# Patient Record
Sex: Male | Born: 1960 | Race: White | Hispanic: No | Marital: Married | Smoking: Former smoker
Health system: Southern US, Community
[De-identification: ages and names within clinical notes are randomized; demographics above are authoritative.]

## PROBLEM LIST (undated history)

## (undated) DIAGNOSIS — C8190 Hodgkin lymphoma, unspecified, unspecified site: Secondary | ICD-10-CM

## (undated) DIAGNOSIS — C649 Malignant neoplasm of unspecified kidney, except renal pelvis: Secondary | ICD-10-CM

## (undated) DIAGNOSIS — R911 Solitary pulmonary nodule: Principal | ICD-10-CM

## (undated) DIAGNOSIS — N529 Male erectile dysfunction, unspecified: Secondary | ICD-10-CM

## (undated) DIAGNOSIS — I10 Essential (primary) hypertension: Secondary | ICD-10-CM

## (undated) DIAGNOSIS — E039 Hypothyroidism, unspecified: Secondary | ICD-10-CM

## (undated) DIAGNOSIS — Z9289 Personal history of other medical treatment: Secondary | ICD-10-CM

## (undated) DIAGNOSIS — E7800 Pure hypercholesterolemia, unspecified: Secondary | ICD-10-CM

## (undated) HISTORY — PX: OTHER SURGICAL HISTORY: SHX169

## (undated) HISTORY — DX: Male erectile dysfunction, unspecified: N52.9

## (undated) HISTORY — DX: Pure hypercholesterolemia, unspecified: E78.00

## (undated) HISTORY — DX: Malignant neoplasm of unspecified kidney, except renal pelvis: C64.9

## (undated) HISTORY — DX: Essential (primary) hypertension: I10

## (undated) HISTORY — DX: Hypothyroidism, unspecified: E03.9

## (undated) HISTORY — DX: Hodgkin lymphoma, unspecified, unspecified site: C81.90

---

## 2013-08-12 DIAGNOSIS — R319 Hematuria, unspecified: Secondary | ICD-10-CM | POA: Insufficient documentation

## 2013-12-12 DIAGNOSIS — R911 Solitary pulmonary nodule: Secondary | ICD-10-CM

## 2013-12-12 HISTORY — DX: Solitary pulmonary nodule: R91.1

## 2014-01-02 DIAGNOSIS — C649 Malignant neoplasm of unspecified kidney, except renal pelvis: Secondary | ICD-10-CM | POA: Insufficient documentation

## 2014-04-14 ENCOUNTER — Other Ambulatory Visit (HOSPITAL_COMMUNITY): Payer: Self-pay | Admitting: Family Medicine

## 2014-04-14 DIAGNOSIS — R918 Other nonspecific abnormal finding of lung field: Secondary | ICD-10-CM

## 2014-04-23 ENCOUNTER — Ambulatory Visit (HOSPITAL_COMMUNITY)
Admission: RE | Admit: 2014-04-23 | Discharge: 2014-04-23 | Disposition: A | Discharge status: Home / Self Care | Payer: BC Managed Care – PPO | Source: Ambulatory Visit | Attending: Family Medicine | Admitting: Family Medicine

## 2014-04-23 DIAGNOSIS — Z905 Acquired absence of kidney: Secondary | ICD-10-CM | POA: Insufficient documentation

## 2014-04-23 DIAGNOSIS — N200 Calculus of kidney: Secondary | ICD-10-CM | POA: Insufficient documentation

## 2014-04-23 DIAGNOSIS — R918 Other nonspecific abnormal finding of lung field: Secondary | ICD-10-CM

## 2014-04-23 DIAGNOSIS — R911 Solitary pulmonary nodule: Secondary | ICD-10-CM | POA: Insufficient documentation

## 2014-04-23 LAB — GLUCOSE, CAPILLARY: GLUCOSE-CAPILLARY: 98 mg/dL (ref 70–99)

## 2014-04-23 MED ORDER — FLUDEOXYGLUCOSE F - 18 (FDG) INJECTION
10.6000 | Freq: Once | INTRAVENOUS | Status: AC | PRN
  Administered 2014-04-23: 10.6 via INTRAVENOUS

## 2014-05-28 DIAGNOSIS — R911 Solitary pulmonary nodule: Secondary | ICD-10-CM | POA: Insufficient documentation

## 2014-05-29 ENCOUNTER — Encounter: Payer: BC Managed Care – PPO | Admitting: Thoracic Surgery (Cardiothoracic Vascular Surgery)

## 2014-05-29 ENCOUNTER — Encounter: Payer: Self-pay | Admitting: Cardiothoracic Surgery

## 2014-05-29 ENCOUNTER — Ambulatory Visit (INDEPENDENT_AMBULATORY_CARE_PROVIDER_SITE_OTHER): Payer: BC Managed Care – PPO | Admitting: Cardiothoracic Surgery

## 2014-05-29 VITALS — BP 119/80 | HR 88 | Resp 20 | Ht 69.0 in | Wt 186.0 lb

## 2014-05-29 DIAGNOSIS — E7800 Pure hypercholesterolemia, unspecified: Secondary | ICD-10-CM | POA: Insufficient documentation

## 2014-05-29 DIAGNOSIS — F419 Anxiety disorder, unspecified: Secondary | ICD-10-CM | POA: Insufficient documentation

## 2014-05-29 DIAGNOSIS — C8190 Hodgkin lymphoma, unspecified, unspecified site: Secondary | ICD-10-CM | POA: Insufficient documentation

## 2014-05-29 DIAGNOSIS — R911 Solitary pulmonary nodule: Secondary | ICD-10-CM

## 2014-05-29 DIAGNOSIS — E079 Disorder of thyroid, unspecified: Secondary | ICD-10-CM | POA: Insufficient documentation

## 2014-05-29 DIAGNOSIS — K219 Gastro-esophageal reflux disease without esophagitis: Secondary | ICD-10-CM | POA: Insufficient documentation

## 2014-05-29 DIAGNOSIS — N529 Male erectile dysfunction, unspecified: Secondary | ICD-10-CM | POA: Insufficient documentation

## 2014-05-29 DIAGNOSIS — C649 Malignant neoplasm of unspecified kidney, except renal pelvis: Secondary | ICD-10-CM

## 2014-05-29 DIAGNOSIS — I10 Essential (primary) hypertension: Secondary | ICD-10-CM | POA: Insufficient documentation

## 2014-05-29 DIAGNOSIS — C4490 Unspecified malignant neoplasm of skin, unspecified: Secondary | ICD-10-CM | POA: Insufficient documentation

## 2014-05-29 NOTE — Progress Notes (Signed)
[address]       [place] [address]             [phone]                    [name] [facility] Medical Record #[id number] Date of Birth: 07-08-1961  Referring: Milus Height, MD Primary Care: Rory Percy, MD  Chief Complaint:    Chief Complaint  Patient presents with  . Lung Lesion    Surgical eval on lung nodule, PET Scan 04/23/14, Chest CT 04/10/14    History of Present Illness:    Ryan Black 53 y.o. male is seen in the office  today for lung nodules and hypermetabolic mediastinal node. Patient  has  previous history of Hodgkin disease diagnosed and treated his radiation in 1990 possibly stage I disease. 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 Brendia Sacks, [facility] for stage I disease. On 04/00 sclerae 2015 patient underwent a dedicated chest CT for previously noticed lung nodule 1.7 x 1.4 cm nodular region in the left upper lobe near the apex. Dr. Rory Percy his primary care provider order his PET/CT on 04/27/2014 which demonstrated hypermetabolic small right paratracheal lymph node associated with hypermetabolic of the subcarinal lymph nodes and possible right hilar hypermetabolic lymphoid tissue 1.7 cm lesion in the left upper lobe shows low level FDG call but concerning for low FDG avid malignancy. Patient admits history of tobacco use for 30 years but quit about 30 years.   Pathology from October 2014 ACCESSION NUMBER: [id number] RECEIVED: 09/16/2013 ORDERING PHYSICIAN: Ivin Booty , MD PATIENT NAME: Ryan Black SURGICAL PATHOLOGY REPORT  FINAL PATHOLOGIC DIAGNOSIS MICROSCOPIC EXAMINATION AND DIAGNOSIS  A. KIDNEY (LEFT), PARTIAL NEPHRECTOMY: Papillary renal cell carcinoma, Fuhrman grade 2, 2.2 cm in greatest dimension. See comment.  Comment: The partial nephrectomy shows a 2.2 cm in  greatest dimension circumscribed papillary renal cell carcinoma. The tumor focally abuts the capsule; however all of the soft tissue resection margins are negative for tumor.        Current Activity/ Functional Status:  Patient is independent with mobility/ambulation, transfers, ADL's, IADL's.   Zubrod Score: At the time of surgery this patient's most appropriate activity status/level should be described as: [x]     0    Normal activity, no symptoms []     1    Restricted in physical strenuous activity but ambulatory, able to do out light work []     2    Ambulatory and capable of self care, unable to do work activities, up and about               >50 % of waking hours                              []     3    Only limited self care, in bed greater than 50% of waking hours []     4    Completely disabled, no self care, confined to bed or chair []     5    Moribund   Past Medical History  Diagnosis Date  . Hypertension   . Hypothyroidism   . ED (erectile dysfunction)   . Hypercholesteremia   . Hodgkin disease 1999, treated with radiation to chest   .  Renal cell carcinoma Partial nephrectomy     left    Past Surgical History  Procedure Laterality Date  . Eye surgery at age 72    . 15 lymph node-right side of neck      Family History  Problem Relation Age of Onset  . Heart disease Brother     MI age 2  . Aortic dissection Father     at age 66  . Hearing loss Mother     History   Social History  . Marital Status: Married    Spouse Name: N/A    Number of Children: 2  . Years of Education: N/A   Occupational History  . Not on file.   Social History Main Topics  . Smoking status: Former Research scientist (life sciences)  . Smokeless tobacco: Never Used  . Alcohol Use: No  . Drug Use: No  . Sexual Activity: Not on file   Other Topics Concern  . Not on file   Social History Narrative  . No narrative on file    History  Smoking status  . Former Smoker  Smokeless tobacco  .  Never Used    History  Alcohol Use No     No Known Allergies  Current Outpatient Prescriptions  Medication Sig Dispense Refill  . levothyroxine (SYNTHROID, LEVOTHROID) 50 MCG tablet Take 50 mcg by mouth daily before breakfast.       . lisinopril-hydrochlorothiazide (PRINZIDE,ZESTORETIC) 20-25 MG per tablet Take 1 tablet by mouth daily.       . tadalafil (CIALIS) 20 MG tablet Take 20 mg by mouth daily as needed for erectile dysfunction.       No current facility-administered medications for this visit.     Review of Systems:     Cardiac Review of Systems: Y or N  Chest Pain [  n  ]  Resting SOB [ n  ] Exertional SOB  [n ]  Orthopnea [  n]   Pedal Edema [ n  ]    Palpitations [ n ] Syncope  [n  ]   Presyncope [ n  ]  General Review of Systems: [Y] = yes [  ]=no Constitional: recent weight change [ n ];  Wt loss over the last 3 months [  n ] anorexia [  ]; fatigue [ y ]; nausea [  ]; night sweats [  ]; fever [  ]; or chills [  ];          Dental: poor dentition[  dentures]; Last Dentist visit:   Eye : blurred vision [  ]; diplopia [   ]; vision changes [  ];  Amaurosis fugax[  ]; Resp: cough [  ];  wheezing[  ];  hemoptysis[  ]; shortness of breath[  ]; paroxysmal nocturnal dyspnea[  ]; dyspnea on exertion[  ]; or orthopnea[  ];  GI:  gallstones[  ], vomiting[  ];  dysphagia[  ]; melena[  ];  hematochezia [  ]; heartburn[  ];   Hx of  Colonoscopy[  ]; GU: kidney stones [  ]; hematuria[  ];   dysuria [  ];  nocturia[  ];  history of     obstruction [  ]; urinary frequency [  ]             Skin: rash, swelling[  ];, hair loss[  ];  peripheral edema[  ];  or itching[  ]; Musculosketetal: myalgias[  ];  joint swelling[  ];  joint erythema[  ];  joint pain[  ];  back pain[  ];  Heme/Lymph: bruising[  ];  bleeding[  ];  anemia[  ];  Neuro: TIA[  ];  headaches[  ];  stroke[  ];  vertigo[  ];  seizures[ n ];   paresthesias[  ];  difficulty walking[n  ];  Psych:depression[  ]; anxiety[   ];  Endocrine: diabetes[ n ];  thyroid dysfunction[ n ];  Immunizations: Flu up to date [  ]; Pneumococcal up to date [  ];  Other:  Physical Exam: BP 119/80  Pulse 88  Resp 20  Ht 5\' 9"  (1.753 m)  Wt 186 lb (84.369 kg)  BMI 27.45 kg/m2  SpO2 98%  PHYSICAL EXAMINATION:  General appearance: alert, cooperative, appears stated age and no distress Neurologic: intact Heart: regular rate and rhythm, S1, S2 normal, no murmur, click, rub or gallop Lungs: clear to auscultation bilaterally Abdomen: soft, non-tender; bowel sounds normal; no masses,  no organomegaly Extremities: extremities normal, atraumatic, no cyanosis or edema and Homans sign is negative, no sign of DVT No cervical or axillary adenopathy  Healing scar abdomen from recent resection " premelenoma"  Diagnostic Studies & Laboratory data:     Recent Radiology Findings:    EXAM:  NUCLEAR MEDICINE PET SKULL BASE TO THIGH  TECHNIQUE:  10.6 mCi F-18 FDG was injected intravenously. Full-ring PET imaging  was performed from the skull base to thigh after the radiotracer. CT  data was obtained and used for attenuation correction and anatomic  localization.  FASTING BLOOD GLUCOSE: Value: 98 mg/dl  COMPARISON: Chest CT from 04/10/2014  FINDINGS:  NECK  No hypermetabolic lymph nodes in the neck.  CHEST  6 mm short axis right paratracheal lymph node is markedly  hypermetabolic with SUV max = 6.4 (image 57). Small subcarinal lymph  nodes are also hypermetabolic with SUV max = 3.4. Focal  hypermetabolism is seen in the right hilum although no definite  underlying lymphadenopathy is evident on this uninfused CT.  The 1.7 x 1.4 cm irregular sub solid lesion in the anteromedial left  upper lobe shows FDG uptake with SUV max = 2. Thoracic surgical  consultation may be 5 mm right middle lobe pulmonary nodule on image  44 does not show hypermetabolism, but is below the ability for  reliable resolution on PET imaging. 3 mm left  upper lobe nodule is  seen on image 30  ABDOMEN/PELVIS  No abnormal hypermetabolic activity within the liver, pancreas,  adrenal glands, or spleen. No hypermetabolic lymph nodes in the  abdomen or pelvis.  Multiple nonobstructing stones are seen in the upper left kidney,  the largest measuring approximately 8 mm in diameter. Focal area of  thinning in the left interpolar kidney is presumably site of partial  nephrectomy.  SKELETON  No focal hypermetabolic activity to suggest skeletal metastasis.  IMPRESSION:  Hypermetabolic small right paratracheal lymph node associated with  hypermetabolism in the subcarinal lymph nodes and possible right  hilar hypermetabolic lymphoid tissue. Although these lymph nodes are  small, the detectable FDG accumulation is concerning for metastatic  involvement.  17 mm sub solid lesion in the anteromedial left upper lobe shows low  level FDG accumulation. While the uptake level is borderline between  infectious/ inflammatory and malignant etiology, differentiated or  low-grade adenocarcinoma can be poorly FDG avid. Thoracic surgery  consultation may be indicated.  Electronically Signed  By: Misty Stanley M.D.  On: 04/23/2014 15:35    Recent Lab Findings: No results found for this  basename: WBC, HGB, HCT, PLT, GLUCOSE, CHOL, TRIG, HDL, LDLDIRECT, LDLCALC, ALT, AST, NA, K, CL, CREATININE, BUN, CO2, TSH, INR, GLUF, HGBA1C      Assessment / Plan:      Patient with history of Hodgkin's lymphoma in 1999 and more recently renal cell carcinoma presents with hypermetabolic mediastinal nodes small and multiple lung nodules that are to small to characterize. I reviewed the radiographic findings with the patient and his wife and recommended that we proceed with bronchoscopy ebus possible mediastinoscopy to biopsy the hypermetabolic mediastinal node. Risks and options of surgery are discussed with the patient in detail and he is willing to proceed. Tentatively  planned for June 24.   Grace Isaac MD      [address] [place] [address] Office [phone]   Beeper [phone]  05/29/2014 5:58 PM

## 2014-05-30 ENCOUNTER — Encounter (HOSPITAL_COMMUNITY): Payer: Self-pay | Admitting: Pharmacy Technician

## 2014-05-30 ENCOUNTER — Encounter: Payer: BC Managed Care – PPO | Admitting: Thoracic Surgery (Cardiothoracic Vascular Surgery)

## 2014-05-30 ENCOUNTER — Other Ambulatory Visit: Payer: Self-pay | Admitting: *Deleted

## 2014-05-30 DIAGNOSIS — R911 Solitary pulmonary nodule: Secondary | ICD-10-CM

## 2014-05-31 NOTE — Pre-Procedure Instructions (Signed)
Ryan Black  05/31/2014   Your procedure is scheduled on:  June 24  Report to [facility] Admitting at 06:30 AM.  Call this number if you have problems the morning of surgery: [phone]   Remember:   Do not eat food or drink liquids after midnight.   Take these medicines the morning of surgery with A SIP OF WATER: Levothyroxine, Ranitidine   STOP Aspirin today   STOP/ Do not take Aspirin, Aleve, Naproxen, Advil, Ibuprofen, Motrin, Vitamins, Herbs, or Supplements starting today   Do not wear jewelry, make-up or nail polish.  Do not wear lotions, powders, or perfumes. You may wear deodorant.  Do not shave 48 hours prior to surgery. Men may shave face and neck.  Do not bring valuables to the hospital.  [facility] is not responsible  for any belongings or valuables.               Contacts, dentures or bridgework may not be worn into surgery.  Leave suitcase in the car. After surgery it may be brought to your room.  For patients admitted to the hospital, discharge time is determined by your treatment team.               Patients discharged the day of surgery will not be allowed to drive home.  Name and phone number of your driver: Family/ Friend  Special Instructions: See [facility] Preparing For Surgery   Please read over the following fact sheets that you were given: Pain Booklet, Coughing and Deep Breathing, Blood Transfusion Information and Surgical Site Infection Prevention

## 2014-05-31 NOTE — Pre-Procedure Instructions (Signed)
[facility] - Preparing for Surgery  Before surgery, you can play an important role.  Because skin is not sterile, your skin needs to be as free of germs as possible.  You can reduce the number of germs on you skin by washing with CHG (chlorahexidine gluconate) soap before surgery.  CHG is an antiseptic cleaner which kills germs and bonds with the skin to continue killing germs even after washing.  Please DO NOT use if you have an allergy to CHG or antibacterial soaps.  If your skin becomes reddened/irritated stop using the CHG and inform your nurse when you arrive at Short Stay.  Do not shave (including legs and underarms) for at least 48 hours prior to the first CHG shower.  You may shave your face.  Please follow these instructions carefully:   1.  Shower with CHG Soap the night before surgery and the morning of Surgery.  2.  If you choose to wash your hair, wash your hair first as usual with your normal shampoo.  3.  After you shampoo, rinse your hair and body thoroughly to remove the shampoo.  4.  Use CHG as you would any other liquid soap.  You can apply CHG directly to the skin and wash gently with scrungie or a clean washcloth.  5.  Apply the CHG Soap to your body ONLY FROM THE NECK DOWN.  Do not use on open wounds or open sores.  Avoid contact with your eyes, ears, mouth and genitals (private parts).  Wash genitals (private parts) with your normal soap.  6.  Wash thoroughly, paying special attention to the area where your surgery will be performed.  7.  Thoroughly rinse your body with warm water from the neck down.  8.  DO NOT shower/wash with your normal soap after using and rinsing off the CHG Soap.  9.  Pat yourself dry with a clean towel.            10.  Wear clean pajamas.            11.  Place clean sheets on your bed the night of your first shower and do not sleep with pets.  Day of Surgery  Do not apply any lotions the morning of surgery.  Please wear clean clothes to the  hospital/surgery center.   

## 2014-06-02 ENCOUNTER — Ambulatory Visit (HOSPITAL_COMMUNITY)
Admission: RE | Admit: 2014-06-02 | Discharge: 2014-06-02 | Disposition: A | Discharge status: Home / Self Care | Payer: BC Managed Care – PPO | Source: Ambulatory Visit | Attending: Cardiothoracic Surgery | Admitting: Cardiothoracic Surgery

## 2014-06-02 ENCOUNTER — Encounter (HOSPITAL_COMMUNITY): Payer: Self-pay

## 2014-06-02 ENCOUNTER — Encounter (HOSPITAL_COMMUNITY)
Admission: RE | Admit: 2014-06-02 | Discharge: 2014-06-02 | Disposition: A | Discharge status: Home / Self Care | Payer: BC Managed Care – PPO | Source: Ambulatory Visit | Attending: Cardiothoracic Surgery | Admitting: Cardiothoracic Surgery

## 2014-06-02 VITALS — BP 122/79 | HR 79 | Temp 97.7°F | Resp 20 | Ht 68.0 in | Wt 184.1 lb

## 2014-06-02 DIAGNOSIS — Z01810 Encounter for preprocedural cardiovascular examination: Secondary | ICD-10-CM | POA: Insufficient documentation

## 2014-06-02 DIAGNOSIS — R911 Solitary pulmonary nodule: Secondary | ICD-10-CM

## 2014-06-02 DIAGNOSIS — Z01818 Encounter for other preprocedural examination: Secondary | ICD-10-CM | POA: Insufficient documentation

## 2014-06-02 HISTORY — DX: Solitary pulmonary nodule: R91.1

## 2014-06-02 HISTORY — DX: Personal history of other medical treatment: Z92.89

## 2014-06-02 LAB — COMPREHENSIVE METABOLIC PANEL
ALT: 23 U/L (ref 0–53)
AST: 16 U/L (ref 0–37)
Albumin: 3.7 g/dL (ref 3.5–5.2)
Alkaline Phosphatase: 64 U/L (ref 39–117)
BUN: 24 mg/dL — ABNORMAL HIGH (ref 6–23)
CO2: 27 mEq/L (ref 19–32)
Calcium: 9.2 mg/dL (ref 8.4–10.5)
Chloride: 100 mEq/L (ref 96–112)
Creatinine, Ser: 1.61 mg/dL — ABNORMAL HIGH (ref 0.50–1.35)
GFR calc Af Amer: 54 mL/min — ABNORMAL LOW (ref >90)
GFR calc non Af Amer: 47 mL/min — ABNORMAL LOW (ref >90)
Glucose, Bld: 78 mg/dL (ref 70–99)
Potassium: 4 mEq/L (ref 3.7–5.3)
Sodium: 137 mEq/L (ref 137–147)
Total Bilirubin: 0.3 mg/dL (ref 0.3–1.2)
Total Protein: 7 g/dL (ref 6.0–8.3)

## 2014-06-02 LAB — PROTIME-INR
INR: 0.97 (ref 0.00–1.49)
Prothrombin Time: 12.7 seconds (ref 11.6–15.2)

## 2014-06-02 LAB — APTT: aPTT: 31 seconds (ref 24–37)

## 2014-06-02 LAB — TYPE AND SCREEN
ABO/RH(D): A POS
Antibody Screen: NEGATIVE

## 2014-06-02 LAB — CBC
HCT: 45.5 % (ref 39.0–52.0)
Hemoglobin: 15.9 g/dL (ref 13.0–17.0)
MCH: 28.5 pg (ref 26.0–34.0)
MCHC: 34.9 g/dL (ref 30.0–36.0)
MCV: 81.7 fL (ref 78.0–100.0)
Platelets: 200 10*3/uL (ref 150–400)
RBC: 5.57 MIL/uL (ref 4.22–5.81)
RDW: 12.8 % (ref 11.5–15.5)
WBC: 6.6 10*3/uL (ref 4.0–10.5)

## 2014-06-02 LAB — ABO/RH: ABO/RH(D): A POS

## 2014-06-02 NOTE — Progress Notes (Signed)
STOP Bang score 5; results faxed to PCP

## 2014-06-03 MED ORDER — DEXTROSE 5 % IV SOLN
1.5000 g | INTRAVENOUS | Status: AC
  Administered 2014-06-04: 1.5 g via INTRAVENOUS
  Filled 2014-06-03: qty 1.5

## 2014-06-04 ENCOUNTER — Ambulatory Visit (HOSPITAL_COMMUNITY)
Admission: RE | Admit: 2014-06-04 | Discharge: 2014-06-04 | Disposition: A | Discharge status: Home / Self Care | Payer: BC Managed Care – PPO | Source: Ambulatory Visit | Attending: Cardiothoracic Surgery | Admitting: Cardiothoracic Surgery

## 2014-06-04 ENCOUNTER — Encounter (HOSPITAL_COMMUNITY): Payer: Self-pay | Admitting: Anesthesiology

## 2014-06-04 ENCOUNTER — Encounter (HOSPITAL_COMMUNITY): Payer: BC Managed Care – PPO | Admitting: Anesthesiology

## 2014-06-04 ENCOUNTER — Ambulatory Visit (HOSPITAL_COMMUNITY): Payer: BC Managed Care – PPO

## 2014-06-04 ENCOUNTER — Encounter (HOSPITAL_COMMUNITY)
Admission: RE | Disposition: A | Discharge status: Home / Self Care | Payer: Self-pay | Source: Ambulatory Visit | Attending: Cardiothoracic Surgery

## 2014-06-04 ENCOUNTER — Ambulatory Visit (HOSPITAL_COMMUNITY): Payer: BC Managed Care – PPO | Admitting: Anesthesiology

## 2014-06-04 DIAGNOSIS — K219 Gastro-esophageal reflux disease without esophagitis: Secondary | ICD-10-CM | POA: Insufficient documentation

## 2014-06-04 DIAGNOSIS — I10 Essential (primary) hypertension: Secondary | ICD-10-CM | POA: Insufficient documentation

## 2014-06-04 DIAGNOSIS — R911 Solitary pulmonary nodule: Secondary | ICD-10-CM

## 2014-06-04 DIAGNOSIS — E039 Hypothyroidism, unspecified: Secondary | ICD-10-CM | POA: Insufficient documentation

## 2014-06-04 DIAGNOSIS — F411 Generalized anxiety disorder: Secondary | ICD-10-CM | POA: Insufficient documentation

## 2014-06-04 DIAGNOSIS — Z87891 Personal history of nicotine dependence: Secondary | ICD-10-CM | POA: Insufficient documentation

## 2014-06-04 DIAGNOSIS — Z905 Acquired absence of kidney: Secondary | ICD-10-CM | POA: Insufficient documentation

## 2014-06-04 DIAGNOSIS — N529 Male erectile dysfunction, unspecified: Secondary | ICD-10-CM | POA: Insufficient documentation

## 2014-06-04 DIAGNOSIS — Z8571 Personal history of Hodgkin lymphoma: Secondary | ICD-10-CM | POA: Insufficient documentation

## 2014-06-04 DIAGNOSIS — Z923 Personal history of irradiation: Secondary | ICD-10-CM | POA: Insufficient documentation

## 2014-06-04 DIAGNOSIS — E7800 Pure hypercholesterolemia, unspecified: Secondary | ICD-10-CM | POA: Insufficient documentation

## 2014-06-04 DIAGNOSIS — Z85528 Personal history of other malignant neoplasm of kidney: Secondary | ICD-10-CM | POA: Insufficient documentation

## 2014-06-04 HISTORY — PX: VIDEO BRONCHOSCOPY WITH ENDOBRONCHIAL ULTRASOUND: SHX6177

## 2014-06-04 HISTORY — PX: MEDIASTINOSCOPY: SHX5086

## 2014-06-04 SURGERY — BRONCHOSCOPY, WITH EBUS
Anesthesia: General | Site: Chest

## 2014-06-04 MED ORDER — ONDANSETRON HCL 4 MG/2ML IJ SOLN
INTRAMUSCULAR | Status: AC
  Filled 2014-06-04: qty 2

## 2014-06-04 MED ORDER — NEOSTIGMINE METHYLSULFATE 10 MG/10ML IV SOLN
INTRAVENOUS | Status: AC
  Filled 2014-06-04: qty 1

## 2014-06-04 MED ORDER — MIDAZOLAM HCL 2 MG/2ML IJ SOLN
INTRAMUSCULAR | Status: AC
  Filled 2014-06-04: qty 2

## 2014-06-04 MED ORDER — PROMETHAZINE HCL 25 MG/ML IJ SOLN: 6.2500 mg | INTRAMUSCULAR | Status: DC | PRN

## 2014-06-04 MED ORDER — HYDROMORPHONE HCL PF 1 MG/ML IJ SOLN
INTRAMUSCULAR | Status: AC
  Filled 2014-06-04: qty 1

## 2014-06-04 MED ORDER — PHENYLEPHRINE HCL 10 MG/ML IJ SOLN
INTRAMUSCULAR | Status: DC | PRN
  Administered 2014-06-04 (×8): 80 ug via INTRAVENOUS

## 2014-06-04 MED ORDER — FENTANYL CITRATE 0.05 MG/ML IJ SOLN
INTRAMUSCULAR | Status: DC | PRN
  Administered 2014-06-04: 50 ug via INTRAVENOUS
  Administered 2014-06-04 (×2): 100 ug via INTRAVENOUS

## 2014-06-04 MED ORDER — GLYCOPYRROLATE 0.2 MG/ML IJ SOLN
INTRAMUSCULAR | Status: AC
  Filled 2014-06-04: qty 2

## 2014-06-04 MED ORDER — SUCCINYLCHOLINE CHLORIDE 20 MG/ML IJ SOLN
INTRAMUSCULAR | Status: AC
  Filled 2014-06-04: qty 1

## 2014-06-04 MED ORDER — CHLORHEXIDINE GLUCONATE CLOTH 2 % EX PADS: 6.0000 | MEDICATED_PAD | Freq: Once | CUTANEOUS | Status: DC

## 2014-06-04 MED ORDER — FENTANYL CITRATE 0.05 MG/ML IJ SOLN
INTRAMUSCULAR | Status: AC
  Filled 2014-06-04: qty 5

## 2014-06-04 MED ORDER — 0.9 % SODIUM CHLORIDE (POUR BTL) OPTIME
TOPICAL | Status: DC | PRN
  Administered 2014-06-04: 50 mL

## 2014-06-04 MED ORDER — LIDOCAINE HCL (CARDIAC) 20 MG/ML IV SOLN
INTRAVENOUS | Status: AC
  Filled 2014-06-04: qty 5

## 2014-06-04 MED ORDER — PHENYLEPHRINE HCL 10 MG/ML IJ SOLN
10.0000 mg | INTRAVENOUS | Status: DC | PRN
  Administered 2014-06-04: 25 ug/min via INTRAVENOUS

## 2014-06-04 MED ORDER — NEOSTIGMINE METHYLSULFATE 10 MG/10ML IV SOLN
INTRAVENOUS | Status: DC | PRN
  Administered 2014-06-04: 3 mg via INTRAVENOUS

## 2014-06-04 MED ORDER — PHENYLEPHRINE 40 MCG/ML (10ML) SYRINGE FOR IV PUSH (FOR BLOOD PRESSURE SUPPORT)
PREFILLED_SYRINGE | INTRAVENOUS | Status: AC
  Filled 2014-06-04: qty 10

## 2014-06-04 MED ORDER — EPHEDRINE SULFATE 50 MG/ML IJ SOLN
INTRAMUSCULAR | Status: AC
  Filled 2014-06-04: qty 1

## 2014-06-04 MED ORDER — MIDAZOLAM HCL 5 MG/5ML IJ SOLN
INTRAMUSCULAR | Status: DC | PRN
  Administered 2014-06-04: 2 mg via INTRAVENOUS

## 2014-06-04 MED ORDER — ROCURONIUM BROMIDE 100 MG/10ML IV SOLN
INTRAVENOUS | Status: DC | PRN
  Administered 2014-06-04: 50 mg via INTRAVENOUS

## 2014-06-04 MED ORDER — ONDANSETRON HCL 4 MG/2ML IJ SOLN
INTRAMUSCULAR | Status: DC | PRN
  Administered 2014-06-04: 4 mg via INTRAVENOUS

## 2014-06-04 MED ORDER — ROCURONIUM BROMIDE 50 MG/5ML IV SOLN
INTRAVENOUS | Status: AC
  Filled 2014-06-04: qty 1

## 2014-06-04 MED ORDER — LIDOCAINE HCL (CARDIAC) 20 MG/ML IV SOLN
INTRAVENOUS | Status: DC | PRN
  Administered 2014-06-04: 80 mg via INTRAVENOUS

## 2014-06-04 MED ORDER — SODIUM CHLORIDE 0.9 % IJ SOLN
INTRAMUSCULAR | Status: AC
  Filled 2014-06-04: qty 10

## 2014-06-04 MED ORDER — HYDROCODONE-ACETAMINOPHEN 5-325 MG PO TABS: 1.0000 | ORAL_TABLET | Freq: Four times a day (QID) | ORAL | Status: AC | PRN

## 2014-06-04 MED ORDER — GLYCOPYRROLATE 0.2 MG/ML IJ SOLN
INTRAMUSCULAR | Status: DC | PRN
  Administered 2014-06-04: 0.4 mg via INTRAVENOUS

## 2014-06-04 MED ORDER — EPINEPHRINE HCL 1 MG/ML IJ SOLN
INTRAMUSCULAR | Status: AC
  Filled 2014-06-04: qty 1

## 2014-06-04 MED ORDER — PROPOFOL 10 MG/ML IV BOLUS
INTRAVENOUS | Status: DC | PRN
  Administered 2014-06-04: 50 mg via INTRAVENOUS
  Administered 2014-06-04: 150 mg via INTRAVENOUS

## 2014-06-04 MED ORDER — LACTATED RINGERS IV SOLN
INTRAVENOUS | Status: DC | PRN
  Administered 2014-06-04 (×2): via INTRAVENOUS

## 2014-06-04 MED ORDER — OXYCODONE HCL 5 MG PO TABS
5.0000 mg | ORAL_TABLET | Freq: Once | ORAL | Status: DC | PRN
Start: 2014-06-04 — End: 2014-06-04

## 2014-06-04 MED ORDER — PROPOFOL 10 MG/ML IV BOLUS
INTRAVENOUS | Status: AC
  Filled 2014-06-04: qty 20

## 2014-06-04 MED ORDER — OXYCODONE HCL 5 MG/5ML PO SOLN: 5.0000 mg | Freq: Once | ORAL | Status: DC | PRN

## 2014-06-04 MED ORDER — HYDROMORPHONE HCL PF 1 MG/ML IJ SOLN
0.2500 mg | INTRAMUSCULAR | Status: DC | PRN
  Administered 2014-06-04 (×2): 0.5 mg via INTRAVENOUS

## 2014-06-04 MED ORDER — PHENYLEPHRINE HCL 10 MG/ML IJ SOLN
INTRAMUSCULAR | Status: AC
  Filled 2014-06-04: qty 1

## 2014-06-04 SURGICAL SUPPLY — 52 items
BLADE SURG 10 STRL SS (BLADE) ×4 IMPLANT
BRUSH CYTOL CELLEBRITY 1.5X140 (MISCELLANEOUS) IMPLANT
CANISTER SUCTION 2500CC (MISCELLANEOUS) ×8 IMPLANT
CLIP TI MEDIUM 6 (CLIP) IMPLANT
CONT SPEC 4OZ CLIKSEAL STRL BL (MISCELLANEOUS) ×12 IMPLANT
COTTONBALL LRG STERILE PKG (GAUZE/BANDAGES/DRESSINGS) IMPLANT
COVER SURGICAL LIGHT HANDLE (MISCELLANEOUS) ×8 IMPLANT
COVER TABLE BACK 60X90 (DRAPES) ×4 IMPLANT
DERMABOND ADHESIVE PROPEN (GAUZE/BANDAGES/DRESSINGS) ×1
DERMABOND ADVANCED (GAUZE/BANDAGES/DRESSINGS) ×1
DERMABOND ADVANCED .7 DNX12 (GAUZE/BANDAGES/DRESSINGS) ×3 IMPLANT
DERMABOND ADVANCED .7 DNX6 (GAUZE/BANDAGES/DRESSINGS) ×3 IMPLANT
DRAPE LAPAROTOMY T 102X78X121 (DRAPES) ×4 IMPLANT
DRSG AQUACEL AG ADV 3.5X14 (GAUZE/BANDAGES/DRESSINGS) ×4 IMPLANT
ELECT CAUTERY BLADE 6.4 (BLADE) ×4 IMPLANT
ELECT REM PT RETURN 9FT ADLT (ELECTROSURGICAL) ×4
ELECTRODE REM PT RTRN 9FT ADLT (ELECTROSURGICAL) ×3 IMPLANT
FORCEPS BIOP RJ4 1.8 (CUTTING FORCEPS) IMPLANT
GAUZE SPONGE 4X4 16PLY XRAY LF (GAUZE/BANDAGES/DRESSINGS) ×4 IMPLANT
GLOVE BIO SURGEON STRL SZ 6.5 (GLOVE) ×8 IMPLANT
GOWN STRL REUS W/ TWL LRG LVL3 (GOWN DISPOSABLE) ×3 IMPLANT
GOWN STRL REUS W/TWL LRG LVL3 (GOWN DISPOSABLE) ×1
HEMOSTAT SURGICEL 2X14 (HEMOSTASIS) IMPLANT
KIT BASIN OR (CUSTOM PROCEDURE TRAY) ×4 IMPLANT
KIT ROOM TURNOVER OR (KITS) ×8 IMPLANT
MARKER SKIN DUAL TIP RULER LAB (MISCELLANEOUS) ×4 IMPLANT
NEEDLE 22X1 1/2 (OR ONLY) (NEEDLE) IMPLANT
NEEDLE BIOPSY TRANSBRONCH 21G (NEEDLE) IMPLANT
NEEDLE BLUNT 18X1 FOR OR ONLY (NEEDLE) IMPLANT
NEEDLE SYS SONOTIP II EBUSTBNA (NEEDLE) ×4 IMPLANT
NS IRRIG 1000ML POUR BTL (IV SOLUTION) ×8 IMPLANT
OIL SILICONE PENTAX (PARTS (SERVICE/REPAIRS)) ×4 IMPLANT
PACK SURGICAL SETUP 50X90 (CUSTOM PROCEDURE TRAY) ×4 IMPLANT
PAD ARMBOARD 7.5X6 YLW CONV (MISCELLANEOUS) ×16 IMPLANT
PENCIL BUTTON HOLSTER BLD 10FT (ELECTRODE) ×4 IMPLANT
SPONGE GAUZE 4X4 12PLY (GAUZE/BANDAGES/DRESSINGS) ×4 IMPLANT
SPONGE INTESTINAL PEANUT (DISPOSABLE) ×4 IMPLANT
STAPLER VISISTAT 35W (STAPLE) IMPLANT
SUT VIC AB 3-0 SH 18 (SUTURE) ×4 IMPLANT
SUT VICRYL 4-0 PS2 18IN ABS (SUTURE) ×4 IMPLANT
SWAB COLLECTION DEVICE MRSA (MISCELLANEOUS) IMPLANT
SYR 20CC LL (SYRINGE) ×4 IMPLANT
SYR 20ML ECCENTRIC (SYRINGE) ×4 IMPLANT
SYR 5ML LUER SLIP (SYRINGE) ×4 IMPLANT
SYR CONTROL 10ML LL (SYRINGE) IMPLANT
SYRINGE 10CC LL (SYRINGE) ×4 IMPLANT
TOWEL OR 17X24 6PK STRL BLUE (TOWEL DISPOSABLE) ×8 IMPLANT
TOWEL OR 17X26 10 PK STRL BLUE (TOWEL DISPOSABLE) ×4 IMPLANT
TRAP SPECIMEN MUCOUS 40CC (MISCELLANEOUS) ×4 IMPLANT
TUBE ANAEROBIC SPECIMEN COL (MISCELLANEOUS) IMPLANT
TUBE CONNECTING 12X1/4 (SUCTIONS) ×8 IMPLANT
WATER STERILE IRR 1000ML POUR (IV SOLUTION) ×4 IMPLANT

## 2014-06-04 NOTE — H&P (Signed)
[address]       [place] [address]             [phone]                    Ryan Black  Medical Record #[id number] Date of Birth: 1961-06-09  Referring: Dr Jacquiline Doe Primary Care: Rory Percy, MD  Chief Complaint:    Mediastinal lymph node   History of Present Illness:    Ryan Black 53 y.o. male is seen in the office   for lung nodules and hypermetabolic mediastinal node. Patient  has  previous history of Hodgkin disease diagnosed and treated his radiation in 1990 possibly stage I disease. 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 Brendia Sacks, [facility] for stage I disease. On 04/2014 patient underwent a dedicated chest CT for previously noticed lung nodule 1.7 x 1.4 cm nodular region in the left upper lobe near the apex. Dr. Rory Percy his primary care provider order his PET/CT on 04/27/2014 which demonstrated hypermetabolic small right paratracheal lymph node associated with hypermetabolic of the subcarinal lymph nodes and possible right hilar hypermetabolic lymphoid tissue 1.7 cm lesion in the left upper lobe shows low level FDG but concerning for low FDG avid malignancy. Patient admits history of tobacco use for 30 years but quit about 30 years.   Pathology from October 2014 ACCESSION NUMBER: [id number] RECEIVED: 09/16/2013 ORDERING PHYSICIAN: Ivin Booty , MD PATIENT NAME: Ryan Black SURGICAL PATHOLOGY REPORT  FINAL PATHOLOGIC DIAGNOSIS MICROSCOPIC EXAMINATION AND DIAGNOSIS  A. KIDNEY (LEFT), PARTIAL NEPHRECTOMY: Papillary renal cell carcinoma, Fuhrman grade 2, 2.2 cm in greatest dimension. See comment.  Comment: The partial nephrectomy shows a 2.2 cm in greatest dimension circumscribed papillary renal cell carcinoma. The tumor focally abuts the capsule; however all of the soft tissue resection  margins are negative for tumor.        Current Activity/ Functional Status:  Patient is independent with mobility/ambulation, transfers, ADL's, IADL's.   Zubrod Score: At the time of surgery this patient's most appropriate activity status/level should be described as: [x]     0    Normal activity, no symptoms []     1    Restricted in physical strenuous activity but ambulatory, able to do out light work []     2    Ambulatory and capable of self care, unable to do work activities, up and about               >50 % of waking hours                              []     3    Only limited self care, in bed greater than 50% of waking hours []     4    Completely disabled, no self care, confined to bed or chair []     5    Moribund   Past Medical History  Diagnosis Date  . Hypertension   . Hypothyroidism   . ED (erectile dysfunction)   . Hypercholesteremia   . Hodgkin disease 1999, treated with radiation to chest   . Renal cell carcinoma Partial nephrectomy     left    Past Surgical History  Procedure Laterality Date  . Eye surgery at age 19    .  1990 lymph node-right side of neck    . Partial nephrectomy Left 09/2014    Family History  Problem Relation Age of Onset  . Heart disease Brother     MI age 54  . Aortic dissection Father     at age 66  . Hearing loss Mother     History   Social History  . Marital Status: Married    Spouse Name: N/A    Number of Children: 2  . Years of Education: N/A   Occupational History  . Not on file.   Social History Main Topics  . Smoking status: Former Smoker -- 2.00 packs/day for 25 years    Types: Cigarettes  . Smokeless tobacco: Never Used     Comment: quit smoking 16 years ago;started age 8  . Alcohol Use: No  . Drug Use: No  . Sexual Activity: Yes   Other Topics Concern  . Not on file   Social History Narrative  . No narrative on file    History  Smoking status  . Former Smoker -- 2.00 packs/day for 25 years  .  Types: Cigarettes  Smokeless tobacco  . Never Used    Comment: quit smoking 16 years ago;started age 15    History  Alcohol Use No     No Known Allergies  Current Facility-Administered Medications  Medication Dose Route Frequency Provider Last Rate Last Dose  . cefUROXime (ZINACEF) 1.5 g in dextrose 5 % 50 mL IVPB  1.5 g Intravenous 60 min Pre-Op Grace Isaac, MD      . Chlorhexidine Gluconate Cloth 2 % PADS 6 each  6 each Topical Once Grace Isaac, MD         Review of Systems:     Cardiac Review of Systems: Y or N  Chest Pain [  n  ]  Resting SOB [ n  ] Exertional SOB  [n ]  Orthopnea [  n]   Pedal Edema [ n  ]    Palpitations [ n ] Syncope  [n  ]   Presyncope [ n  ]  General Review of Systems: [Y] = yes [  ]=no Constitional: recent weight change [ n ];  Wt loss over the last 3 months [  n ] anorexia [  ]; fatigue [ y ]; nausea [  ]; night sweats [  ]; fever [  ]; or chills [  ];          Dental: poor dentition[  dentures]; Last Dentist visit:   Eye : blurred vision [  ]; diplopia [   ]; vision changes [  ];  Amaurosis fugax[  ]; Resp: cough [  ];  wheezing[  ];  hemoptysis[  ]; shortness of breath[  ]; paroxysmal nocturnal dyspnea[  ]; dyspnea on exertion[  ]; or orthopnea[  ];  GI:  gallstones[  ], vomiting[  ];  dysphagia[  ]; melena[  ];  hematochezia [  ]; heartburn[  ];   Hx of  Colonoscopy[  ]; GU: kidney stones [  ]; hematuria[  ];   dysuria [  ];  nocturia[  ];  history of     obstruction [  ]; urinary frequency [  ]             Skin: rash, swelling[  ];, hair loss[  ];  peripheral edema[  ];  or itching[  ]; Musculosketetal: myalgias[  ];  joint swelling[  ];  joint erythema[  ];  joint pain[  ];  back pain[  ];  Heme/Lymph: bruising[  ];  bleeding[  ];  anemia[  ];  Neuro: TIA[  ];  headaches[  ];  stroke[  ];  vertigo[  ];  seizures[ n ];   paresthesias[  ];  difficulty walking[n  ];  Psych:depression[  ]; anxiety[  ];  Endocrine: diabetes[ n ];  thyroid  dysfunction[ n ];  Immunizations: Flu up to date [  ]; Pneumococcal up to date [  ];  Other:  Physical Exam: BP 144/76  Pulse 69  Temp(Src) 98.6 F (37 C) (Oral)  Resp 20  SpO2 99%  PHYSICAL EXAMINATION:  General appearance: alert, cooperative, appears stated age and no distress Neurologic: intact Heart: regular rate and rhythm, S1, S2 normal, no murmur, click, rub or gallop Lungs: clear to auscultation bilaterally Abdomen: soft, non-tender; bowel sounds normal; no masses,  no organomegaly Extremities: extremities normal, atraumatic, no cyanosis or edema and Homans sign is negative, no sign of DVT No cervical or axillary adenopathy  Healing scar abdomen from recent resection " premelenoma"  Diagnostic Studies & Laboratory data:     Recent Radiology Findings:    EXAM:  NUCLEAR MEDICINE PET SKULL BASE TO THIGH  TECHNIQUE:  10.6 mCi F-18 FDG was injected intravenously. Full-ring PET imaging  was performed from the skull base to thigh after the radiotracer. CT  data was obtained and used for attenuation correction and anatomic  localization.  FASTING BLOOD GLUCOSE: Value: 98 mg/dl  COMPARISON: Chest CT from 04/10/2014  FINDINGS:  NECK  No hypermetabolic lymph nodes in the neck.  CHEST  6 mm short axis right paratracheal lymph node is markedly  hypermetabolic with SUV max = 6.4 (image 57). Small subcarinal lymph  nodes are also hypermetabolic with SUV max = 3.4. Focal  hypermetabolism is seen in the right hilum although no definite  underlying lymphadenopathy is evident on this uninfused CT.  The 1.7 x 1.4 cm irregular sub solid lesion in the anteromedial left  upper lobe shows FDG uptake with SUV max = 2. Thoracic surgical  consultation may be 5 mm right middle lobe pulmonary nodule on image  44 does not show hypermetabolism, but is below the ability for  reliable resolution on PET imaging. 3 mm left upper lobe nodule is  seen on image 30  ABDOMEN/PELVIS  No abnormal  hypermetabolic activity within the liver, pancreas,  adrenal glands, or spleen. No hypermetabolic lymph nodes in the  abdomen or pelvis.  Multiple nonobstructing stones are seen in the upper left kidney,  the largest measuring approximately 8 mm in diameter. Focal area of  thinning in the left interpolar kidney is presumably site of partial  nephrectomy.  SKELETON  No focal hypermetabolic activity to suggest skeletal metastasis.  IMPRESSION:  Hypermetabolic small right paratracheal lymph node associated with  hypermetabolism in the subcarinal lymph nodes and possible right  hilar hypermetabolic lymphoid tissue. Although these lymph nodes are  small, the detectable FDG accumulation is concerning for metastatic  involvement.  17 mm sub solid lesion in the anteromedial left upper lobe shows low  level FDG accumulation. While the uptake level is borderline between  infectious/ inflammatory and malignant etiology, differentiated or  low-grade adenocarcinoma can be poorly FDG avid. Thoracic surgery  consultation may be indicated.  Electronically Signed  By: Misty Stanley M.D.  On: 04/23/2014 15:35    Recent Lab Findings: Lab Results  Component Value Date   WBC  6.6 06/02/2014      Assessment / Plan:      Patient with history of Hodgkin's lymphoma in 1999 and more recently renal cell carcinoma presents with hypermetabolic mediastinal nodes small and multiple lung nodules that are to small to characterize. I reviewed the radiographic findings with the patient and his wife and recommended that we proceed with bronchoscopy ebus possible mediastinoscopy to biopsy the hypermetabolic mediastinal node. Risks and options of surgery are discussed with the patient in detail and he is willing to proceed. The goals risks and alternatives of the planned surgical procedure  bronchoscopy ebus possible mediastinoscopy  have been discussed with the patient in detail. The risks of the procedure including  death, infection, stroke, myocardial infarction, bleeding, blood transfusion have all been discussed specifically.  I have quoted Darleen Crocker a 1 % of perioperative mortality and a complication rate as high as 10 %. The patient's questions have been answered.BASSEM BERNASCONI is willing  to proceed with the planned procedure.      Grace Isaac MD      [address] [place] [address] Office [phone]   Beeper [phone]  06/04/2014 6:56 AM

## 2014-06-04 NOTE — Anesthesia Preprocedure Evaluation (Addendum)
Anesthesia Evaluation  Patient identified by MRN, date of birth, ID band Patient awake    Reviewed: Allergy & Precautions, H&P , NPO status , Patient's Chart, lab work & pertinent test results  Airway Mallampati: II TM Distance: >3 FB Neck ROM: Full    Dental  (+) Missing, Dental Advisory Given   Pulmonary former smoker,    Pulmonary exam normal       Cardiovascular hypertension,     Neuro/Psych PSYCHIATRIC DISORDERS Anxiety negative neurological ROS     GI/Hepatic Neg liver ROS, GERD-  Medicated,  Endo/Other  Hypothyroidism   Renal/GU negative Renal ROS     Musculoskeletal negative musculoskeletal ROS (+)   Abdominal   Peds  Hematology negative hematology ROS (+)   Anesthesia Other Findings   Reproductive/Obstetrics                          Anesthesia Physical Anesthesia Plan  ASA: III  Anesthesia Plan: General   Post-op Pain Management:    Induction: Intravenous  Airway Management Planned: Oral ETT  Additional Equipment:   Intra-op Plan:   Post-operative Plan: Extubation in OR  Informed Consent: I have reviewed the patients History and Physical, chart, labs and discussed the procedure including the risks, benefits and alternatives for the proposed anesthesia with the patient or authorized representative who has indicated his/her understanding and acceptance.   Dental advisory given  Plan Discussed with: CRNA, Anesthesiologist and Surgeon  Anesthesia Plan Comments:        Anesthesia Quick Evaluation

## 2014-06-04 NOTE — Discharge Instructions (Signed)
Mediastinoscopy, Care After Refer to this sheet in the next few weeks. These instructions provide you with information on caring for yourself after your procedure. Your health care provider may also give you more specific instructions. Your treatment has been planned according to current medical practices, but problems sometimes occur. Call your health care provider if you have any problems or questions after your procedure. WHAT TO EXPECT AFTER THE PROCEDURE After your procedure, it is typical to have the following:  Slight pain in the incision area.  Sore throat. HOME CARE INSTRUCTIONS   Only take over-the-counter or prescription medicines as directed by your health care provider.  Use showers for bathing. Do not take baths or use swimming pools or hot tubs until your health care provider approves.  Remove your bandages (dressings) after 48 hours unless your health care provider tells you otherwise.  You may resume your normal diet.  Limit your activities as directed by your health care provider. Avoid lifting or driving until your health care provider approves.  Follow up with your health care provider as directed. Make sure you know how to get any test results. SEEK MEDICAL CARE IF:  You have increased bleeding from the incision site.  You have redness, swelling, or increasing pain in the wound.  You see pus coming from the wound.  You have a fever.  You notice a bad smell coming from the wound or dressing. SEEK IMMEDIATE MEDICAL CARE IF:   You have a rash.  You have difficulty breathing.  You have chest pain or shortness of breath. Document Released: 06/17/2005 Document Revised: 09/18/2013 Document Reviewed: 07/24/2013 [facility] Patient Information 2015 [place], [place]. This information is not intended to replace advice given to you by your health care provider. Make sure you discuss any questions you have with your health care provider.

## 2014-06-04 NOTE — Op Note (Signed)
NAME:  Ryan Black, ATTAR NO.:  [id number]  MEDICAL RECORD NO.:  [id number]  LOCATION:  [facility]                         FACILITY:  [facility]  PHYSICIAN:  Lanelle Bal, MD    DATE OF BIRTH:  [birth year]  DATE OF PROCEDURE:  06/04/2014 DATE OF DISCHARGE:  06/04/2014                              OPERATIVE REPORT   PREOPERATIVE DIAGNOSIS:  Hypermetabolic mediastinal lymph node.  POSTOPERATIVE DIAGNOSIS:  Hypermetabolic mediastinal lymph node.  SURGICAL PROCEDURE:  Bronchoscopy, EBUS with transbronchial biopsy, mediastinoscopy with mediastinal lymph node biopsy.  SURGEON:  Lanelle Bal, MD  BRIEF HISTORY:  The patient is a 53 year old male with a previous history of Hodgkin lymphoma diagnosed from a right cervical lymph node more than 20 years ago treated with radiation.  Recently, he was found to have an early stage renal cell carcinoma and underwent partial nephrectomy at [facility].  He has also had a precancerous pre melanoma lesion removed from the anterior abdominal wall in the past several months.  The patient had a CT scan, and then a subsequent PET scan that demonstrated several non-hypermetabolic less than 6 mm or less lung nodules, 1 on the right, 1 on the left, and an ill defined area in the left upper apex.  In addition, he had mediastinal lymph node along the right trachea that was significantly hypermetabolic though not significantly enlarged.  The patient was referred to assist in obtaining a tissue diagnosis.  Bronchoscopy with EBUS and transbronchial biopsy and possible mediastinoscopy was discussed with the patient as the best way to obtain a tissue diagnosis from the area on the PET scan that was the most active.  The patient agreed and signed informed consent.  DESCRIPTION OF PROCEDURE:  The patient underwent general endotracheal anesthesia without incident.  Appropriate time-out was performed.  A video bronchoscope was then passed through  the endotracheal tube to the subsegmental level in both the right and left tracheobronchial tree without evidence of endobronchial lesions.  The scope was removed and an EBUS scope was then passed into the trachea.  An area of suspicious activity in the mediastinal nodes along with 2R region peritracheal node approximately mid trachea noted, and this area was identified and several passes transbronchial biopsies were obtained.  Initial smears were reviewed by pathology and there was insufficient material to make a definitive diagnosis.  With this, we then proceeded with mediastinoscopy.  The patient's neck and chest was prepped with Betadine and draped in sterile manner.  A small transverse incision was made in the suprasternal notch and dissection carried down to the pretracheal area.  With a previous history of mediastinal radiation, we proceeded carefully and were able to open up the pretracheal fascial tissue planes without a lot of difficulty.  A mediastinoscope was introduced into the mediastinum in the area suspicious on the PET scan where several small lymph nodes which were biopsied in the 2R area.  There is no area corresponded with the findings on the PET scan.  A small portion of the lymph node was sent for tissue culture.  The remaining was sent for permanent examination by pathology.  With the operative field hemostatic, the scope was removed.  The  cervical fascia was closed with interrupted 3-0 Vicryl, running 4-0 subcuticular stitch in the skin edge.  Dermabond was applied.  Sponge and needle count was reported as correct at the completion of the procedure.  The blood loss was minimal.  The patient tolerated the procedure without obvious complication, was extubated in the operating room and transferred to the recovery room for postoperative care.     Lanelle Bal, MD     EG/MEDQ  D:  06/04/2014  T:  06/04/2014  Job:  [id number]  cc:   Gaston Islam. Tressie Stalker, MD

## 2014-06-04 NOTE — Brief Op Note (Signed)
      [address]       [place] [address]             [phone]     06/04/2014  10:20 AM  PATIENT:  Darleen Crocker  53 y.o. male  PRE-OPERATIVE DIAGNOSIS:  Mediastinal adenopathy  POST-OPERATIVE DIAGNOSIS: Mediastinal adenopathy/final path pending  PROCEDURE:  Procedure(s): VIDEO BRONCHOSCOPY WITH ENDOBRONCHIAL ULTRASOUND (N/A) MEDIASTINOSCOPY (N/A)  SURGEON:  Surgeon(s) and Role:    * Grace Isaac, MD - Primary    ANESTHESIA:   general  EBL:  Total I/O In: 1000 [I.V.:1000] Out: -   BLOOD ADMINISTERED:none  DRAINS: none   LOCAL MEDICATIONS USED:  NONE  SPECIMEN:  Source of Specimen:  2r lymph nodes  DISPOSITION OF SPECIMEN:  PATHOLOGY  COUNTS:  YES   DICTATION: .Dragon Dictation  PLAN OF CARE: Discharge to home after PACU  PATIENT DISPOSITION:  PACU - hemodynamically stable.   Delay start of Pharmacological VTE agent (>24hrs) due to surgical blood loss or risk of bleeding: yes

## 2014-06-04 NOTE — Anesthesia Postprocedure Evaluation (Signed)
Anesthesia Post Note  Patient: Ryan Black  Procedure(s) Performed: Procedure(s) (LRB): VIDEO BRONCHOSCOPY WITH ENDOBRONCHIAL ULTRASOUND (N/A) MEDIASTINOSCOPY (N/A)  Anesthesia type: general  Patient location: PACU  Post pain: Pain level controlled  Post assessment: Patient's Cardiovascular Status Stable  Last Vitals:  Filed Vitals:   06/04/14 1135  BP: 109/71  Pulse: 59  Temp:   Resp: 17    Post vital signs: Reviewed and stable  Level of consciousness: sedated  Complications: No apparent anesthesia complications

## 2014-06-04 NOTE — Transfer of Care (Signed)
Immediate Anesthesia Transfer of Care Note  Patient: Ryan Black  Procedure(s) Performed: Procedure(s): VIDEO BRONCHOSCOPY WITH ENDOBRONCHIAL ULTRASOUND (N/A) MEDIASTINOSCOPY (N/A)  Patient Location: PACU  Anesthesia Type:General  Level of Consciousness: awake, alert , oriented and patient cooperative  Airway & Oxygen Therapy: Patient Spontanous Breathing and Patient connected to nasal cannula oxygen  Post-op Assessment: Report given to PACU RN, Post -op Vital signs reviewed and stable and Patient moving all extremities  Post vital signs: Reviewed and stable  Complications: No apparent anesthesia complications

## 2014-06-05 ENCOUNTER — Encounter (HOSPITAL_COMMUNITY): Payer: Self-pay | Admitting: Cardiothoracic Surgery

## 2014-06-07 LAB — TISSUE CULTURE
Culture: NO GROWTH
Gram Stain: NONE SEEN

## 2014-06-16 ENCOUNTER — Ambulatory Visit (INDEPENDENT_AMBULATORY_CARE_PROVIDER_SITE_OTHER): Payer: BC Managed Care – PPO | Admitting: Physician Assistant

## 2014-06-16 VITALS — BP 144/85 | HR 74 | Resp 20 | Ht 68.0 in | Wt 184.0 lb

## 2014-06-16 DIAGNOSIS — Z09 Encounter for follow-up examination after completed treatment for conditions other than malignant neoplasm: Secondary | ICD-10-CM

## 2014-06-16 NOTE — Progress Notes (Signed)
       [address]       [place] [address]             [phone]          HPI: Patient returns for routine postoperative follow-up having undergone bronchoscopy, EBUS, mediastinoscopy and lymph node biopsy by Dr. Servando Snare on 06/04/2014. He tolerated the procedure well, and has had no issues since returning home.      Current Outpatient Prescriptions  Medication Sig Dispense Refill  . aspirin EC 81 MG tablet Take 81 mg by mouth daily.      Marland Kitchen HYDROcodone-acetaminophen (NORCO/VICODIN) 5-325 MG per tablet Take 1 tablet by mouth every 6 (six) hours as needed for moderate pain.  25 tablet  0  . levothyroxine (SYNTHROID, LEVOTHROID) 50 MCG tablet Take 50 mcg by mouth daily before breakfast.       . lisinopril-hydrochlorothiazide (PRINZIDE,ZESTORETIC) 20-25 MG per tablet Take 1 tablet by mouth daily.       . ranitidine (ZANTAC) 150 MG tablet Take 150 mg by mouth daily as needed for heartburn.        No current facility-administered medications for this visit.     Physical Exam: BP 144/85 HR 74 Resp 20 Wounds: Clean and dry Heart: regular rate and rhythm Lungs: Clear to auscultation    Diagnostic Tests: Chest xray: No results found.    Pathology: Lymph node, biopsy, 2R - ANTHRACOTIC LYMPH NODE. - NO MALIGNANCY IDENTIFIED.   Assessment/Plan: The patient is doing well. Pathology reveals no evidence of malignancy, and we have discussed this.  He is followed regularly in [facility] by Dr. Jacquiline Doe, and has an appointment there in 2 weeks.  He did not have a chest x-ray today, and states that per the [facility], he is supposed to limit his radiation exposure.  I will not order a chest x-ray today, but will discuss this with Dr. Servando Snare, as well as the need for additional follow up in our office.  He is stable from a surgical standpoint.

## 2014-06-17 ENCOUNTER — Ambulatory Visit: Payer: BC Managed Care – PPO | Admitting: Cardiothoracic Surgery

## 2014-07-01 LAB — FUNGUS CULTURE W SMEAR: Fungal Smear: NONE SEEN

## 2014-07-17 LAB — AFB CULTURE WITH SMEAR (NOT AT ARMC): Acid Fast Smear: NONE SEEN

## 2014-09-11 HISTORY — PX: PARTIAL NEPHRECTOMY: SHX414

## 2015-09-17 IMAGING — CT NM PET TUM IMG INITIAL (PI) SKULL BASE T - THIGH
8 series · 25 of 25 positions shown · non-contrast
Comparison: Chest CT from 04/10/2014

CLINICAL DATA: Initial treatment strategy for lung nodule.

EXAM:
NUCLEAR MEDICINE PET SKULL BASE TO THIGH
TECHNIQUE: 10.6 mCi F-18 FDG was injected intravenously. Full-ring PET imaging
was performed from the skull base to thigh after the radiotracer. CT
data was obtained and used for attenuation correction and anatomic
localization.
FASTING BLOOD GLUCOSE:  Value: 98 mg/dl

[Series 3: pet sk_thigh ac · axial · 5.0mm · 4.07mm/px · z∈[-1230,-318]mm · 5 of 229 slices shown]
[im 1/229]
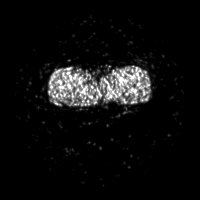
[im 58/229]
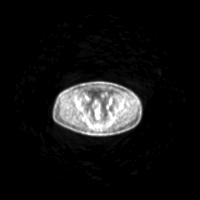
[im 115/229]
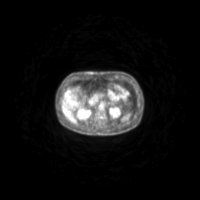
[im 172/229]
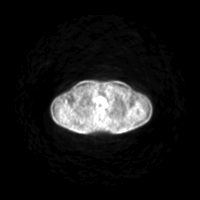
[im 229/229]
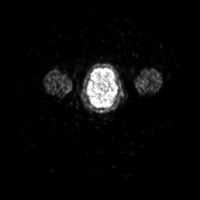

[Series 4: ct sk_thigh 5.0 b31f · axial · 5.0mm · 0.98mm/px · z∈[-1230,-318]mm · 5 of 229 slices shown]
[im 1/229]
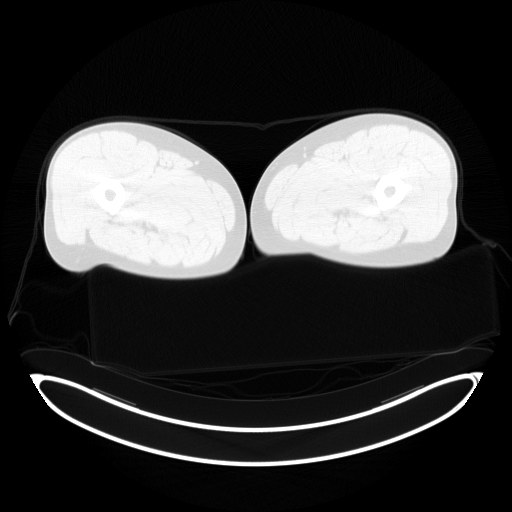
[im 58/229]
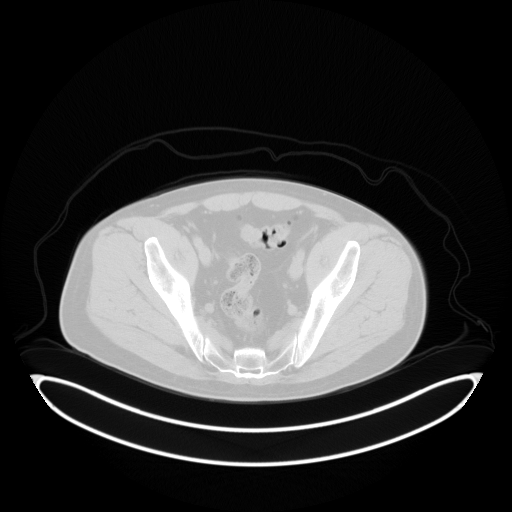
[im 115/229]
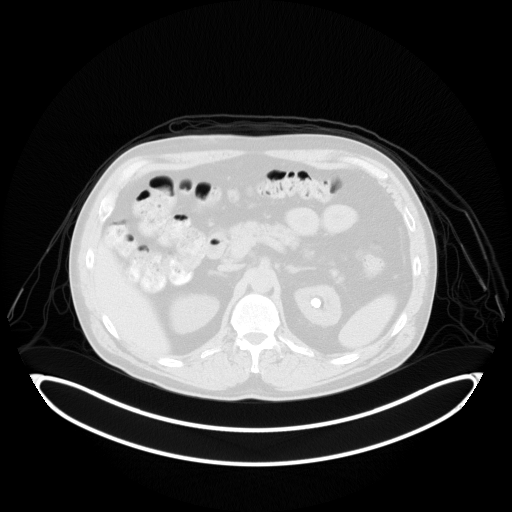
[im 172/229]
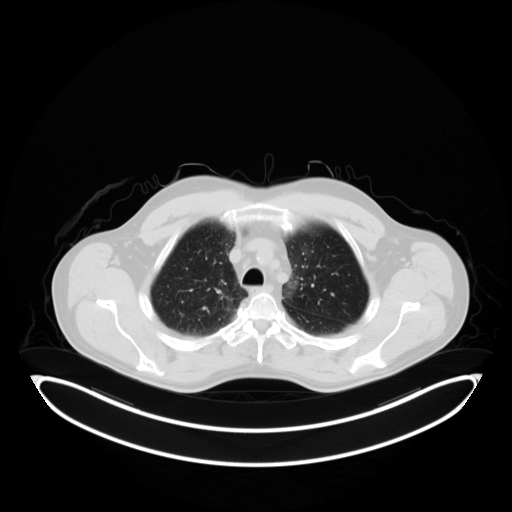
[im 229/229  brain]
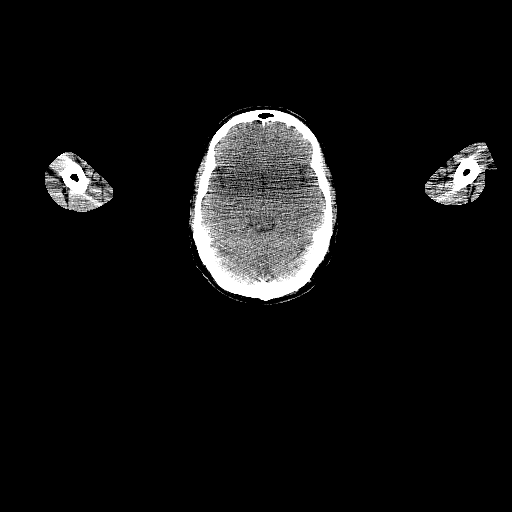

[Series 7: pet sk_thigh nac · axial · 5.0mm · 4.07mm/px · z∈[-1230,-318]mm · 5 of 229 slices shown]
[im 1/229]
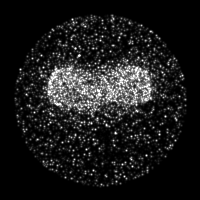
[im 58/229]
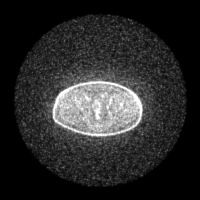
[im 115/229]
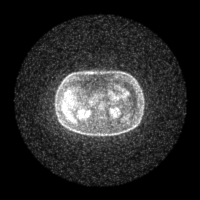
[im 172/229]
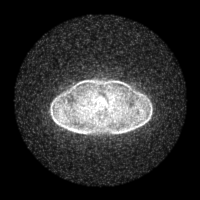
[im 229/229]
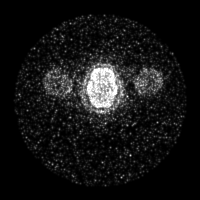

[Series 8: ct sk_thigh 5.0 b70f (id)_bone · axial · 5.0mm · 0.68mm/px · z∈[-770,-482]mm · 2 of 73 slices shown]
[im 1/73  bone]
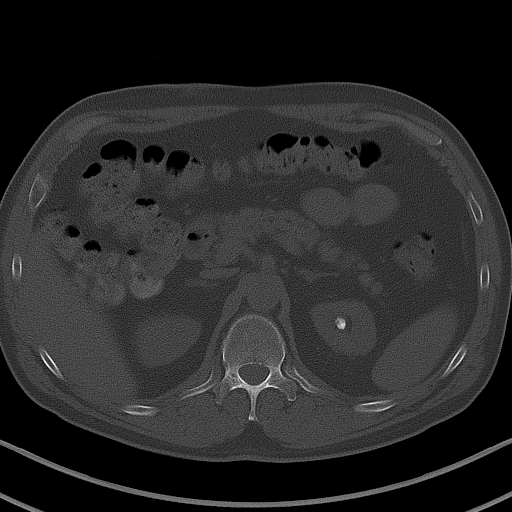
[im 73/73  bone]
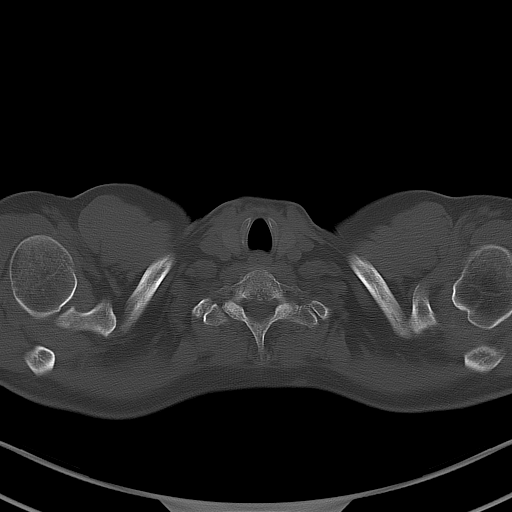

[Series 604: mip collection<mip range> · coronal · 1.89mm/px · 1 of 32 slices shown]
[im 1/32]
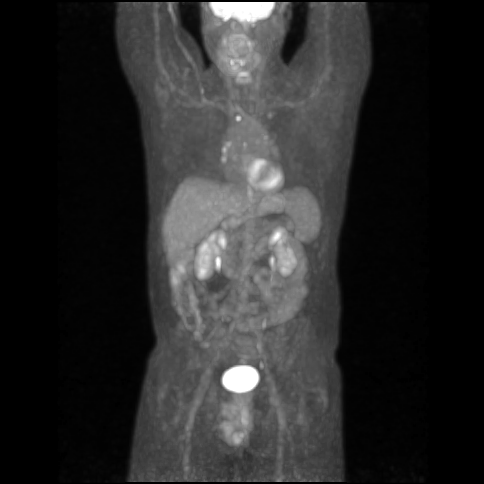

[Series 605: range-ct sk_thigh 5.0 (id)<alpha range> · 2 of 83 slices shown (1 of 2)]
[im 1/83]
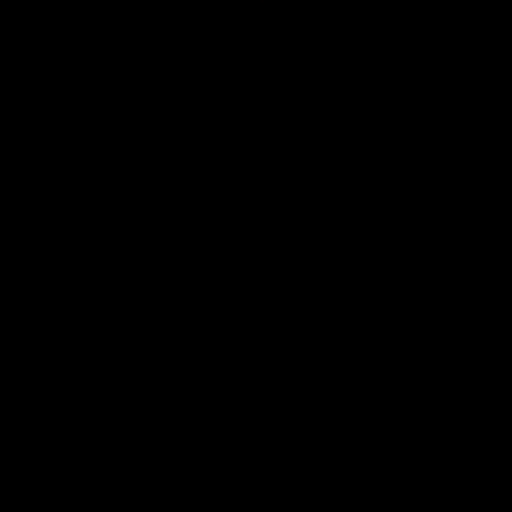
[im 83/83]
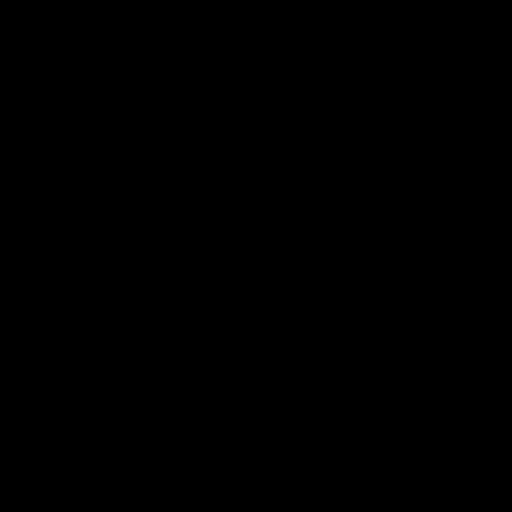

[Series 606: range-ct sk_thigh 5.0 (id)<alpha range> · 4 of 183 slices shown (2 of 2)]
[im 1/183]
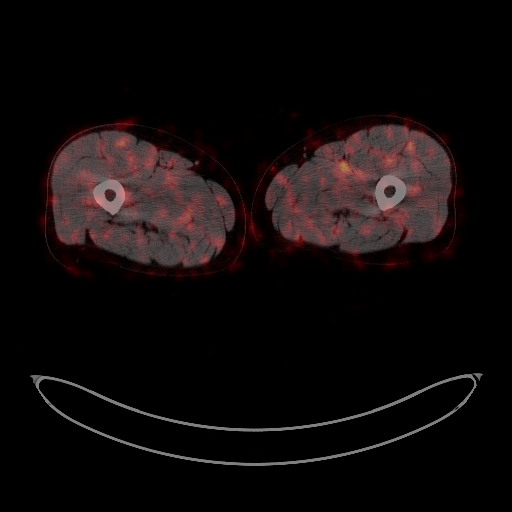
[im 61/183]
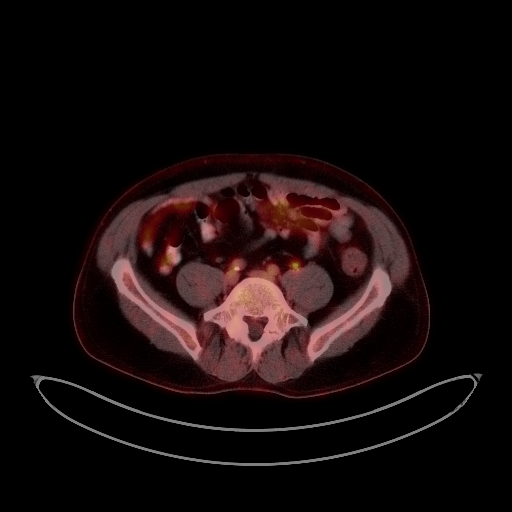
[im 122/183]
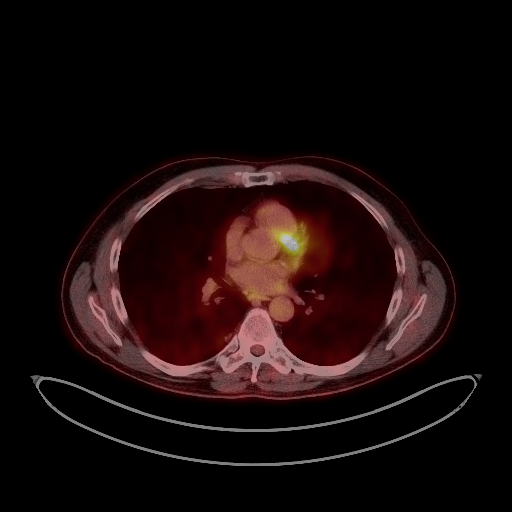
[im 183/183]
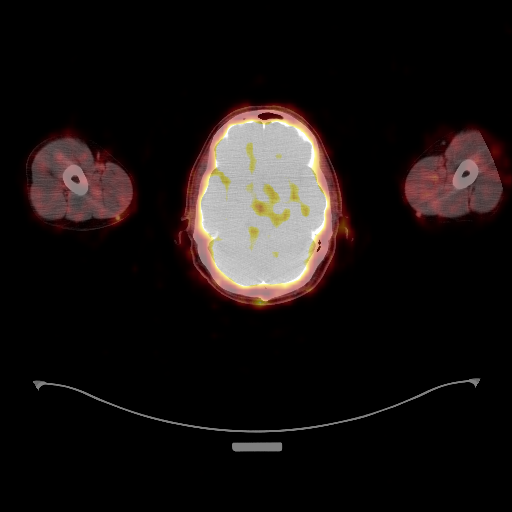

[Series 1032: results mm oncology reading · 5.0mm · 0.95mm/px · 1 of 5 slices shown]
[im 1/5]
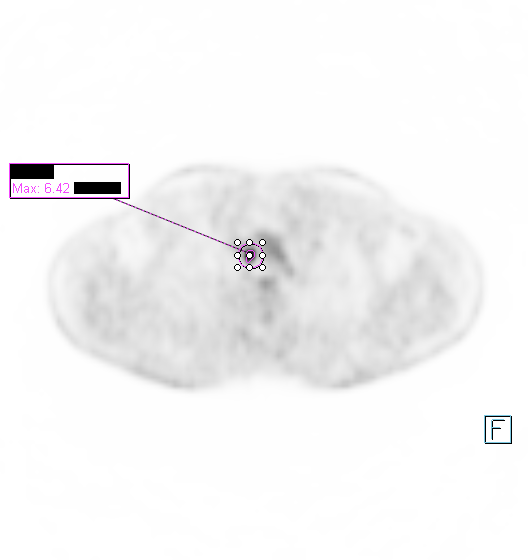

[25 of 25 positions shown; findings below may reference images not displayed]

FINDINGS: NECK

No hypermetabolic lymph nodes in the neck.

CHEST

6 mm short axis right paratracheal lymph node is markedly
hypermetabolic with SUV max = 6.4 (image 57). Small subcarinal lymph
nodes are also hypermetabolic with SUV max = 3.4. Focal
hypermetabolism is seen in the right hilum although no definite
underlying lymphadenopathy is evident on this uninfused CT.

The [DATE] x 1.4 cm irregular sub solid lesion in the anteromedial left
upper lobe shows FDG uptake with SUV max = 2. Thoracic surgical
consultation [DATE] mm right middle lobe pulmonary nodule on image
44 does not show hypermetabolism, but is below the ability for
reliable resolution on PET imaging. 3 mm left upper lobe nodule is
seen on image 30

ABDOMEN/PELVIS

No abnormal hypermetabolic activity within the liver, pancreas,
adrenal glands, or spleen. No hypermetabolic lymph nodes in the
abdomen or pelvis.

Multiple nonobstructing stones are seen in the upper left kidney,
the largest measuring approximately 8 mm in diameter. Focal area of
thinning in the left interpolar kidney is presumably site of partial
nephrectomy.

SKELETON

No focal hypermetabolic activity to suggest skeletal metastasis.
IMPRESSION: Hypermetabolic small right paratracheal lymph node associated with
hypermetabolism in the subcarinal lymph nodes and possible right
hilar hypermetabolic lymphoid tissue. Although these lymph nodes are
small, the detectable FDG accumulation is concerning for metastatic
involvement.

17 mm sub solid lesion in the anteromedial left upper lobe shows low
level FDG accumulation. While the uptake level is borderline between
infectious/ inflammatory and malignant etiology, differentiated or
low-grade adenocarcinoma can be poorly FDG avid. Thoracic surgery
consultation may be indicated.

## 2016-03-14 ENCOUNTER — Other Ambulatory Visit (HOSPITAL_COMMUNITY): Payer: Self-pay | Admitting: Oncology

## 2016-03-14 DIAGNOSIS — C642 Malignant neoplasm of left kidney, except renal pelvis: Secondary | ICD-10-CM

## 2016-03-23 ENCOUNTER — Ambulatory Visit (HOSPITAL_COMMUNITY): Payer: 59

## 2016-03-30 ENCOUNTER — Ambulatory Visit (HOSPITAL_COMMUNITY): Payer: 59
